# Patient Record
Sex: Female | Born: 1985 | Race: White | Hispanic: No | Marital: Single
Health system: Southern US, Community
[De-identification: ages and names within clinical notes are randomized; demographics above are authoritative.]

## PROBLEM LIST (undated history)

## (undated) DIAGNOSIS — J45909 Unspecified asthma, uncomplicated: Secondary | ICD-10-CM

---
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
## 2006-04-09 ENCOUNTER — Emergency Department: Payer: Self-pay | Admitting: Emergency Medicine

## 2006-07-05 ENCOUNTER — Emergency Department: Payer: Self-pay | Admitting: Emergency Medicine

## 2006-08-29 ENCOUNTER — Emergency Department: Payer: Self-pay | Admitting: Unknown Physician Specialty

## 2007-05-19 ENCOUNTER — Emergency Department: Payer: Self-pay | Admitting: Emergency Medicine

## 2007-10-07 ENCOUNTER — Emergency Department: Payer: Self-pay | Admitting: Emergency Medicine

## 2007-12-18 ENCOUNTER — Emergency Department: Payer: Self-pay | Admitting: Unknown Physician Specialty

## 2008-08-09 ENCOUNTER — Emergency Department: Payer: Self-pay | Admitting: Emergency Medicine

## 2008-08-26 ENCOUNTER — Emergency Department: Payer: Self-pay | Admitting: Emergency Medicine

## 2009-07-30 ENCOUNTER — Emergency Department: Payer: Self-pay | Admitting: Emergency Medicine

## 2009-10-02 ENCOUNTER — Emergency Department: Payer: Self-pay | Admitting: Emergency Medicine

## 2010-02-23 ENCOUNTER — Emergency Department: Payer: Self-pay | Admitting: Emergency Medicine

## 2010-02-24 ENCOUNTER — Emergency Department: Payer: Self-pay | Admitting: Emergency Medicine

## 2010-08-16 ENCOUNTER — Emergency Department: Payer: Self-pay | Admitting: Emergency Medicine

## 2010-09-19 ENCOUNTER — Emergency Department: Payer: Self-pay | Admitting: Internal Medicine

## 2011-01-18 ENCOUNTER — Emergency Department: Payer: Self-pay | Admitting: Emergency Medicine

## 2012-10-25 ENCOUNTER — Emergency Department: Payer: Self-pay | Admitting: Emergency Medicine

## 2014-10-04 ENCOUNTER — Emergency Department: Payer: Self-pay

## 2014-10-04 ENCOUNTER — Encounter: Payer: Self-pay | Admitting: Emergency Medicine

## 2014-10-04 DIAGNOSIS — S99921A Unspecified injury of right foot, initial encounter: Secondary | ICD-10-CM | POA: Insufficient documentation

## 2014-10-04 DIAGNOSIS — Z720 Tobacco use: Secondary | ICD-10-CM | POA: Insufficient documentation

## 2014-10-04 DIAGNOSIS — Y9289 Other specified places as the place of occurrence of the external cause: Secondary | ICD-10-CM | POA: Insufficient documentation

## 2014-10-04 DIAGNOSIS — W01198A Fall on same level from slipping, tripping and stumbling with subsequent striking against other object, initial encounter: Secondary | ICD-10-CM | POA: Insufficient documentation

## 2014-10-04 DIAGNOSIS — Y9389 Activity, other specified: Secondary | ICD-10-CM | POA: Insufficient documentation

## 2014-10-04 DIAGNOSIS — Y998 Other external cause status: Secondary | ICD-10-CM | POA: Insufficient documentation

## 2014-10-04 NOTE — ED Notes (Signed)
Patient states that she slipped and hit her right first toe. Patient with laceration to toe. Bleeding controlled.

## 2014-10-05 ENCOUNTER — Emergency Department
Admission: EM | Admit: 2014-10-05 | Discharge: 2014-10-05 | Discharge status: Against Medical Advice | Payer: Self-pay | Attending: Emergency Medicine | Admitting: Emergency Medicine

## 2019-01-20 ENCOUNTER — Other Ambulatory Visit: Payer: Self-pay

## 2019-01-20 ENCOUNTER — Emergency Department: Payer: Self-pay

## 2019-01-20 ENCOUNTER — Emergency Department
Admission: EM | Admit: 2019-01-20 | Discharge: 2019-01-20 | Disposition: A | Discharge status: Home / Self Care | Payer: Self-pay | Attending: Emergency Medicine | Admitting: Emergency Medicine

## 2019-01-20 ENCOUNTER — Encounter: Payer: Self-pay | Admitting: Emergency Medicine

## 2019-01-20 DIAGNOSIS — Z20828 Contact with and (suspected) exposure to other viral communicable diseases: Secondary | ICD-10-CM | POA: Insufficient documentation

## 2019-01-20 DIAGNOSIS — J189 Pneumonia, unspecified organism: Secondary | ICD-10-CM | POA: Insufficient documentation

## 2019-01-20 DIAGNOSIS — F17210 Nicotine dependence, cigarettes, uncomplicated: Secondary | ICD-10-CM | POA: Insufficient documentation

## 2019-01-20 MED ORDER — ALBUTEROL SULFATE HFA 108 (90 BASE) MCG/ACT IN AERS: 2.0000 | INHALATION_SPRAY | Freq: Four times a day (QID) | RESPIRATORY_TRACT | 0 refills | Status: AC | PRN

## 2019-01-20 MED ORDER — AZITHROMYCIN 500 MG PO TABS
500.0000 mg | ORAL_TABLET | Freq: Once | ORAL | Status: AC
  Administered 2019-01-20: 500 mg via ORAL
  Filled 2019-01-20: qty 1

## 2019-01-20 MED ORDER — BENZONATATE 100 MG PO CAPS: ORAL_CAPSULE | ORAL | 0 refills | Status: AC

## 2019-01-20 MED ORDER — AZITHROMYCIN 250 MG PO TABS: 250.0000 mg | ORAL_TABLET | Freq: Every day | ORAL | 0 refills | Status: AC

## 2019-01-20 NOTE — Discharge Instructions (Signed)
Your CXR is concerning for a pneumonia, including viral causes (COVID).  You will be tested for COVID, and you will be notified of POSITIVE results by phone. Take the antibiotic as directed. Take the other prescription meds as needed. Rest, hydrate, and return to the ED as needed.

## 2019-01-20 NOTE — ED Notes (Signed)
Pt reports cough and working in [employer] that's "34 degrees all the time", SOB with exertion

## 2019-01-20 NOTE — ED Provider Notes (Signed)
[facility] Emergency Department Provider Note ____________________________________________  Time seen: 2008  I have reviewed the triage vital signs and the nursing notes.  HISTORY  Chief Complaint  Cough and Generalized Body Aches  HPI Stephanie Hanson is a 33 y.o. female presents to the ED for evaluation of sudden onset of productive cough and general bodyaches, upon awakening. She also notes a sore throat. She is denying fevers, nausea, or SOB. She denies any sick contacts, recent travel or other exposures. She has previously been screened for COVID last month.    History reviewed. No pertinent past medical history.  There are no active problems to display for this patient.  History reviewed. No pertinent surgical history.  Prior to Admission medications   Medication Sig Start Date End Date Taking? Authorizing Provider  albuterol (VENTOLIN HFA) 108 (90 Base) MCG/ACT inhaler Inhale 2 puffs into the lungs every 6 (six) hours as needed for wheezing or shortness of breath. 01/20/19   Bronte Kropf, Dannielle Karvonen, PA-C  azithromycin (ZITHROMAX Z-PAK) 250 MG tablet Take 1 tablet (250 mg total) by mouth daily for 4 days. 01/21/19 01/25/19  Adaleah Forget, Dannielle Karvonen, PA-C  benzonatate (TESSALON PERLES) 100 MG capsule Take 1-2 tabs TID prn cough 01/20/19   Shatika Grinnell, Dannielle Karvonen, PA-C    Allergies Patient has no known allergies.  No family history on file.  Social History Social History   Tobacco Use  . Smoking status: Current Every Day Smoker    Packs/day: 0.50    Years: 12.00    Pack years: 6.00    Types: Cigarettes  Substance Use Topics  . Alcohol use: Yes    Alcohol/week: 14.0 standard drinks    Types: 14 Standard drinks or equivalent per week  . Drug use: No    Review of Systems  Constitutional: Negative for fever. Re[ports bodyaches.  Eyes: Negative for visual changes. ENT: Negative for sore throat. Cardiovascular: Negative for chest  pain. Respiratory: Negative for shortness of breath. Reports productive cough Gastrointestinal: Negative for abdominal pain, vomiting and diarrhea. Genitourinary: Negative for dysuria. Musculoskeletal: Negative for back pain. Skin: Negative for rash. Neurological: Negative for headaches, focal weakness or numbness. ____________________________________________  PHYSICAL EXAM:  VITAL SIGNS: ED Triage Vitals [01/20/19 1824]  Enc Vitals Group     BP (!) 126/91     Pulse Rate 78     Resp 18     Temp 98.3 F (36.8 C)     Temp Source Oral     SpO2 100 %     Weight 190 lb (86.2 kg)     Height 5\' 5"  (1.651 m)     Head Circumference      Peak Flow      Pain Score 8     Pain Loc      Pain Edu?      Excl. in [place]?     Constitutional: Alert and oriented. Well appearing and in no distress. Head: Normocephalic and atraumatic. Eyes: Conjunctivae are normal. PERRL. Normal extraocular movements Ears: Canals clear. TMs intact bilaterally. Nose: No congestion/rhinorrhea/epistaxis. Mouth/Throat: Mucous membranes are moist. Neck: Supple. No thyromegaly. Hematological/Lymphatic/Immunological: No cervical lymphadenopathy. Cardiovascular: Normal rate, regular rhythm. Normal distal pulses. Respiratory: Normal respiratory effort. No wheezes/rales/rhonchi. Gastrointestinal: Soft and nontender. No distention. Musculoskeletal: Nontender with normal range of motion in all extremities.  Neurologic:  Normal gait without ataxia. Normal speech and language. No gross focal neurologic deficits are appreciated. Skin:  Skin is warm, dry and intact. No rash  noted. ____________________________________________   LABS (pertinent positives/negatives) Labs Reviewed  SARS CORONAVIRUS 2 (TAT 6-24 HRS)  ____________________________________________   RADIOLOGY  DG CXR 1V  IMPRESSION: Pneumonia.  Clinical correlation and follow-up is  recommended. ____________________________________________  PROCEDURES  Azithromycin 500 mg PO Procedures ____________________________________________  INITIAL IMPRESSION / ASSESSMENT AND PLAN / ED COURSE  Patient with ED evaluation of sudden onset of productive cough this morning. She has a benign exam without signs of respiratory distress, dehydration, or sepsis. Her CXR confirms a pneumonia. She will be be treated empirically with azithromycin, and COVID test is pending since there is radiologic concern for a possible atypical etiology, as well. Prescriptions for Tessalon Perles, and albuterol are also sent.   Stephanie Hanson was evaluated in Emergency Department on 01/20/2019 for the symptoms described in the history of present illness. She was evaluated in the context of the global COVID-19 pandemic, which necessitated consideration that the patient might be at risk for infection with the SARS-CoV-2 virus that causes COVID-19. Institutional protocols and algorithms that pertain to the evaluation of patients at risk for COVID-19 are in a state of rapid change based on information released by regulatory bodies including the CDC and federal and state organizations. These policies and algorithms were followed during the patient's care in the ED. ____________________________________________  FINAL CLINICAL IMPRESSION(S) / ED DIAGNOSES  Final diagnoses:  Community acquired pneumonia, unspecified laterality      Karmen Stabs, Charlesetta Ivory, PA-C 01/20/19 2131    Shaune Pollack, MD 01/22/19 [phone]

## 2019-01-20 NOTE — ED Triage Notes (Signed)
Cough, generalized body aches and sore throat since this am.

## 2019-01-20 NOTE — ED Notes (Signed)
No peripheral IV placed this visit.   Discharge instructions reviewed with patient. Questions fielded by this RN. Patient verbalizes understanding of instructions. Patient discharged home in stable condition per Jenise, PA. No acute distress noted at time of discharge.   

## 2019-01-21 LAB — SARS CORONAVIRUS 2 (TAT 6-24 HRS): SARS Coronavirus 2: NEGATIVE

## 2019-01-22 ENCOUNTER — Telehealth: Payer: Self-pay | Admitting: General Practice

## 2019-01-22 NOTE — Telephone Encounter (Signed)
Patient called in and received covid test result

## 2019-01-24 ENCOUNTER — Other Ambulatory Visit: Payer: Self-pay

## 2019-01-24 ENCOUNTER — Encounter: Payer: Self-pay | Admitting: Emergency Medicine

## 2019-01-24 ENCOUNTER — Emergency Department
Admission: EM | Admit: 2019-01-24 | Discharge: 2019-01-24 | Disposition: A | Discharge status: Home / Self Care | Payer: Medicaid Other | Attending: Emergency Medicine | Admitting: Emergency Medicine

## 2019-01-24 DIAGNOSIS — J189 Pneumonia, unspecified organism: Secondary | ICD-10-CM | POA: Insufficient documentation

## 2019-01-24 DIAGNOSIS — F17210 Nicotine dependence, cigarettes, uncomplicated: Secondary | ICD-10-CM | POA: Insufficient documentation

## 2019-01-24 NOTE — Discharge Instructions (Signed)
Follow-up with your primary care provider or [facility] if you do not have a provider.  Their contact information is listed on your discharge papers.  Also increase fluids.  Decrease or discontinue smoking as this will help with your pneumonia and your coughing.

## 2019-01-24 NOTE — ED Triage Notes (Signed)
Says she is here for recheck of pneumonia.  Says she took antibiotics.

## 2019-01-24 NOTE — ED Provider Notes (Signed)
[facility] Emergency Department Provider Note  ____________________________________________   First MD Initiated Contact with Patient 01/24/19 0940     (approximate)  I have reviewed the triage vital signs and the nursing notes.   HISTORY  Chief Complaint Cough   HPI Stephanie Hanson is a 33 y.o. female presents to the ED for "recheck of her pneumonia".  Patient was seen in the ED on 01/20/2019 at which time she was diagnosed with community-acquired pneumonia and treated with Zithromax.  Patient denies any fever, chills, nausea or vomiting.  She was at work today when she coughed a couple times and was sent to the emergency department to be checked before coming to work.  Patient's Covid test from 10/24 was negative.     History reviewed. No pertinent past medical history.  There are no active problems to display for this patient.   History reviewed. No pertinent surgical history.  Prior to Admission medications   Medication Sig Start Date End Date Taking? Authorizing Provider  albuterol (VENTOLIN HFA) 108 (90 Base) MCG/ACT inhaler Inhale 2 puffs into the lungs every 6 (six) hours as needed for wheezing or shortness of breath. 01/20/19   Menshew, Dannielle Karvonen, PA-C  azithromycin (ZITHROMAX Z-PAK) 250 MG tablet Take 1 tablet (250 mg total) by mouth daily for 4 days. 01/21/19 01/25/19  Menshew, Dannielle Karvonen, PA-C  benzonatate (TESSALON PERLES) 100 MG capsule Take 1-2 tabs TID prn cough 01/20/19   Menshew, Dannielle Karvonen, PA-C    Allergies Patient has no known allergies.  No family history on file.  Social History Social History   Tobacco Use  . Smoking status: Current Every Day Smoker    Packs/day: 0.50    Years: 12.00    Pack years: 6.00    Types: Cigarettes  . Smokeless tobacco: Never Used  Substance Use Topics  . Alcohol use: Yes    Alcohol/week: 14.0 standard drinks    Types: 14 Standard drinks or equivalent per week  . Drug use: No     Review of Systems Constitutional: No fever/chills Cardiovascular: Denies chest pain. Respiratory: Denies shortness of breath.  Positive for cough. Gastrointestinal: No abdominal pain.  No nausea, no vomiting. Genitourinary: Negative for dysuria. Musculoskeletal: Negative for muscle aches. Skin: Negative for rash. Neurological: Negative for headaches, focal weakness or numbness. ___________________________________________   PHYSICAL EXAM:  VITAL SIGNS: ED Triage Vitals  Enc Vitals Group     BP 01/24/19 0858 119/75     Pulse Rate 01/24/19 0858 88     Resp 01/24/19 0858 18     Temp 01/24/19 0858 98 F (36.7 C)     Temp Source 01/24/19 0858 Oral     SpO2 01/24/19 0858 99 %     Weight 01/24/19 0851 189 lb 13.1 oz (86.1 kg)     Height 01/24/19 0851 5\' 5"  (1.651 m)     Head Circumference --      Peak Flow --      Pain Score 01/24/19 0854 7     Pain Loc --      Pain Edu? --      Excl. in [place]? --    Constitutional: Alert and oriented. Well appearing and in no acute distress. Eyes: Conjunctivae are normal.  Head: Atraumatic. Neck: No stridor.   Cardiovascular: Normal rate, regular rhythm. Grossly normal heart sounds.  Good peripheral circulation. Respiratory: Normal respiratory effort.  No retractions. Lungs CTAB. Gastrointestinal: Soft and nontender. No distention. No  abdominal bruits. No CVA tenderness. Neurologic:  Normal speech and language. No gross focal neurologic deficits are appreciated. No gait instability. Skin:  Skin is warm, dry and intact. No rash noted. Psychiatric: Mood and affect are normal. Speech and behavior are normal.  ____________________________________________   LABS (all labs ordered are listed, but only abnormal results are displayed)  Labs Reviewed - No data to display __________________________________________   PROCEDURES  Procedure(s) performed (including Critical Care):  Procedures  ____________________________________________    INITIAL IMPRESSION / ASSESSMENT AND PLAN / ED COURSE  As part of my medical decision making, I reviewed the following data within the electronic MEDICAL RECORD NUMBER Notes from prior ED visits and [place] Controlled Substance Database  33 year old female presents to the ED with complaint of dysuria for 1 week and also complains of her right knee hurting for the last 3 months.  There is been no history of injury to her knee.  She has not been taking over-the-counter medication for this.  Patient has been taking Azo over-the-counter for her dysuria which helped early in her symptoms.  She denies any flank pain, fever or chills.  Urinalysis confirms a UTI.  X-rays and physical exam was unremarkable for her right knee.  Patient was discharged with a prescription for meloxicam 15 mg daily, Keflex 500 mg 3 times daily and Pyridium 200 mg 3 times daily.  Patient is encouraged to increase fluids.  She is to follow-up with her PCP if any continued problems and return to the emergency department if any severe worsening of her symptoms.  ____________________________________________   FINAL CLINICAL IMPRESSION(S) / ED DIAGNOSES  Final diagnoses:  Community acquired pneumonia, unspecified laterality  Cigarette smoker     ED Discharge Orders    None       Note:  This document was prepared using Dragon voice recognition software and may include unintentional dictation errors.    Tommi Rumps, PA-C 01/24/19 1607    Sharman Cheek, MD 01/26/19 2044

## 2019-09-26 ENCOUNTER — Telehealth: Payer: Self-pay | Admitting: General Practice

## 2019-09-26 NOTE — Telephone Encounter (Signed)
Individual has been contacted regarding ED referral and ended the phone call. No further attempts to contact individual will be made. 

## 2019-10-15 ENCOUNTER — Other Ambulatory Visit: Payer: Self-pay

## 2019-10-15 ENCOUNTER — Encounter: Payer: Self-pay | Admitting: Emergency Medicine

## 2019-10-15 ENCOUNTER — Emergency Department
Admission: EM | Admit: 2019-10-15 | Discharge: 2019-10-15 | Disposition: A | Discharge status: Home / Self Care | Payer: Medicaid Other | Attending: Emergency Medicine | Admitting: Emergency Medicine

## 2019-10-15 ENCOUNTER — Telehealth: Payer: Self-pay | Admitting: Emergency Medicine

## 2019-10-15 DIAGNOSIS — Z5321 Procedure and treatment not carried out due to patient leaving prior to being seen by health care provider: Secondary | ICD-10-CM | POA: Insufficient documentation

## 2019-10-15 DIAGNOSIS — R1110 Vomiting, unspecified: Secondary | ICD-10-CM | POA: Insufficient documentation

## 2019-10-15 HISTORY — DX: Unspecified asthma, uncomplicated: J45.909

## 2019-10-15 LAB — CBC
HCT: 49.3 % — ABNORMAL HIGH (ref 36.0–46.0)
Hemoglobin: 16.4 g/dL — ABNORMAL HIGH (ref 12.0–15.0)
MCH: 34 pg (ref 26.0–34.0)
MCHC: 33.3 g/dL (ref 30.0–36.0)
MCV: 102.1 fL — ABNORMAL HIGH (ref 80.0–100.0)
Platelets: 255 10*3/uL (ref 150–400)
RBC: 4.83 MIL/uL (ref 3.87–5.11)
RDW: 13.7 % (ref 11.5–15.5)
WBC: 11 10*3/uL — ABNORMAL HIGH (ref 4.0–10.5)
nRBC: 0 % (ref 0.0–0.2)

## 2019-10-15 LAB — COMPREHENSIVE METABOLIC PANEL
ALT: 32 U/L (ref 0–44)
AST: 22 U/L (ref 15–41)
Albumin: 3.7 g/dL (ref 3.5–5.0)
Alkaline Phosphatase: 83 U/L (ref 38–126)
Anion gap: 3 — ABNORMAL LOW (ref 5–15)
BUN: 14 mg/dL (ref 6–20)
CO2: 29 mmol/L (ref 22–32)
Calcium: 8.7 mg/dL — ABNORMAL LOW (ref 8.9–10.3)
Chloride: 106 mmol/L (ref 98–111)
Creatinine, Ser: 0.87 mg/dL (ref 0.44–1.00)
GFR calc Af Amer: >60 mL/min (ref >60)
GFR calc non Af Amer: >60 mL/min (ref >60)
Glucose, Bld: 95 mg/dL (ref 70–99)
Potassium: 4.2 mmol/L (ref 3.5–5.1)
Sodium: 138 mmol/L (ref 135–145)
Total Bilirubin: 0.4 mg/dL (ref 0.3–1.2)
Total Protein: 6.9 g/dL (ref 6.5–8.1)

## 2019-10-15 LAB — LIPASE, BLOOD: Lipase: 39 U/L (ref 11–51)

## 2019-10-15 NOTE — Telephone Encounter (Signed)
Patient called me back.  She does have a pcp and will call for appt.

## 2019-10-15 NOTE — Telephone Encounter (Signed)
Called patient due to lwot to inquire about condition and follow up plans. Left message.   

## 2019-10-15 NOTE — ED Triage Notes (Addendum)
Patient states that she woke up an hour ago and vomited dark brown blood about an hour ago. Patient denies abdominal pain.

## 2019-11-20 ENCOUNTER — Other Ambulatory Visit: Payer: Medicaid Other

## 2019-11-26 ENCOUNTER — Ambulatory Visit: Payer: Medicaid Other

## 2019-11-26 ENCOUNTER — Ambulatory Visit: Payer: Medicaid Other | Attending: Internal Medicine

## 2019-11-26 DIAGNOSIS — Z23 Encounter for immunization: Secondary | ICD-10-CM

## 2019-11-26 NOTE — Progress Notes (Signed)
   Covid-19 Vaccination Clinic  Name:  Stephanie Hanson    MRN: [id number] DOB: 1985-09-02  11/26/2019  [name] was observed post Covid-19 immunization for 15 minutes without incident. She was provided with Vaccine Information Sheet and instruction to access the V-Safe system.   [name] was instructed to call 911 with any severe reactions post vaccine: Marland Kitchen Difficulty breathing  . Swelling of face and throat  . A fast heartbeat  . A bad rash all over body  . Dizziness and weakness   Immunizations Administered    Name Date Dose VIS Date Route   Pfizer COVID-19 Vaccine 11/26/2019 11:09 AM 0.3 mL 05/23/2018 Intramuscular   Manufacturer: [employer], [employer]   Lot: [id number]   NDC: [address]-0

## 2019-12-17 ENCOUNTER — Ambulatory Visit: Payer: Medicaid Other

## 2020-07-04 IMAGING — DX DG CHEST 1V PORT
1 series · 1 of 1 positions shown · non-contrast
Comparison: Chest radiograph dated 07/05/2006

CLINICAL DATA: 33-year-old female with cough and generalized body
aches.

EXAM:
PORTABLE CHEST 1 VIEW

[chest ap]
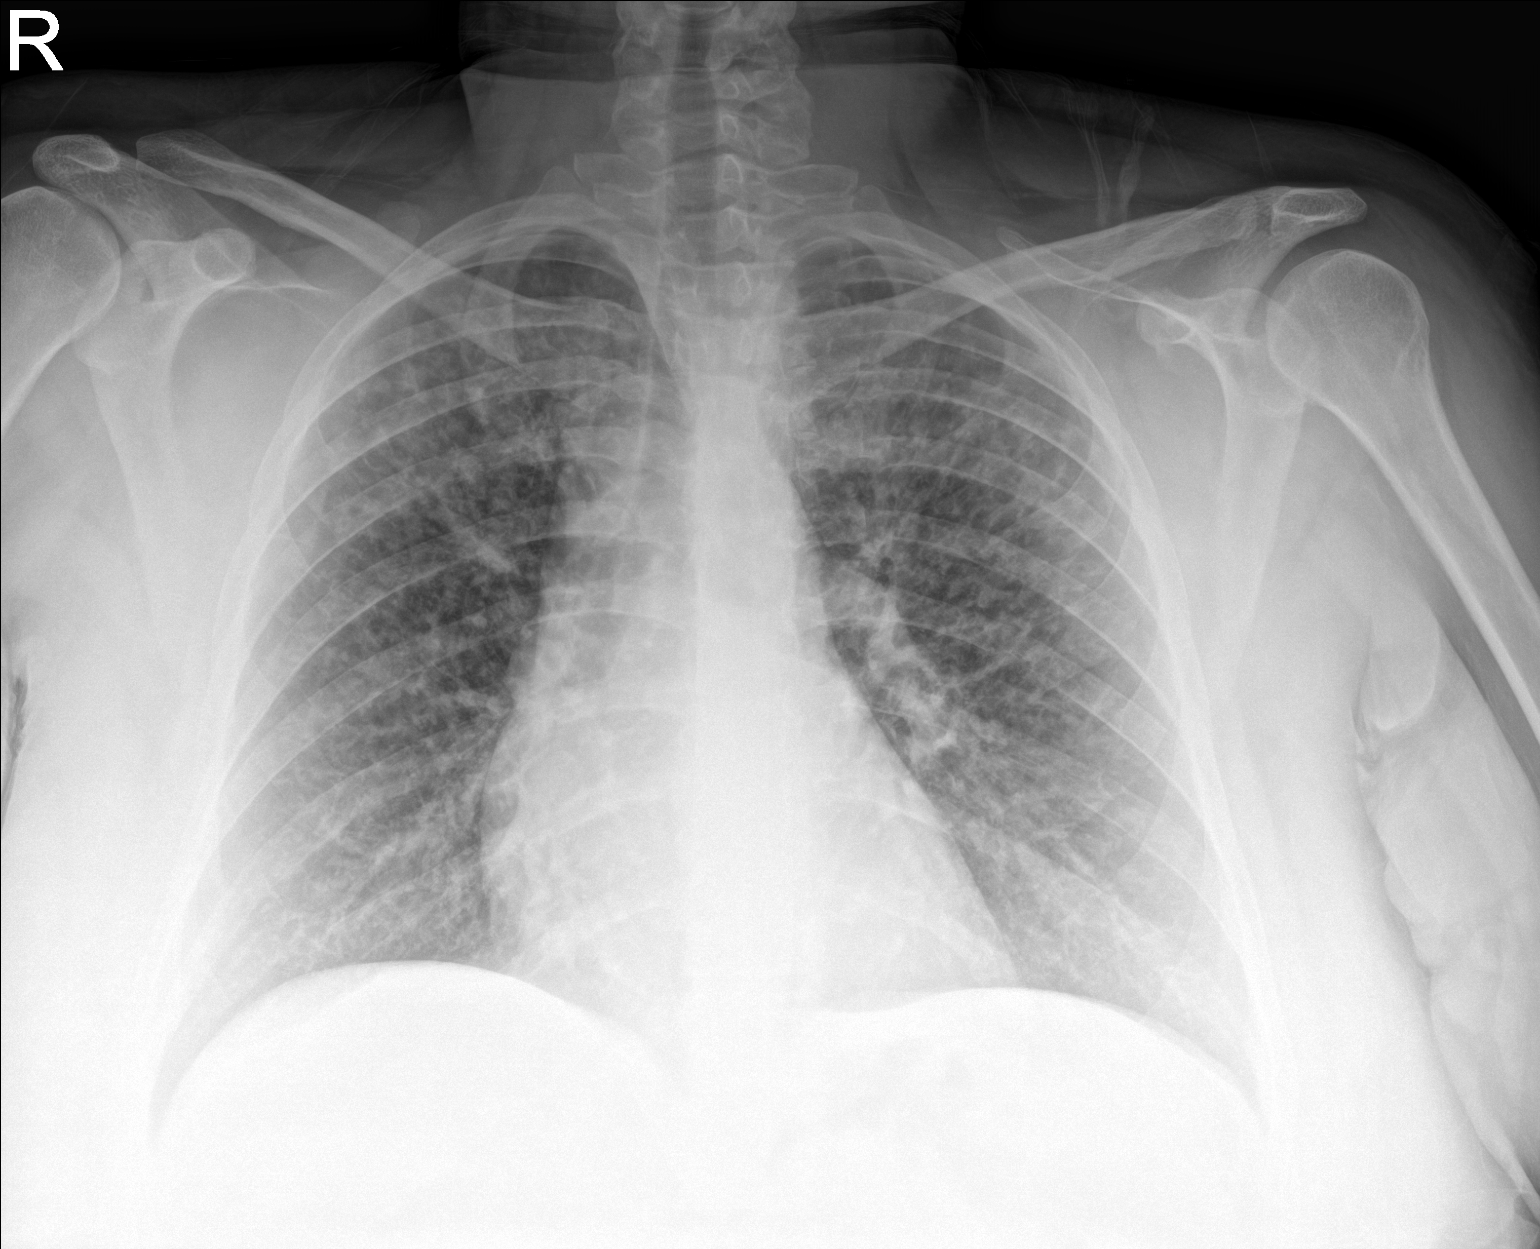

[1 of 1 positions shown; findings below may reference images not displayed]

FINDINGS: Diffuse reticulonodular densities throughout the lungs most
consistent with pneumonia, possibly atypical in etiology. Clinical
correlation is recommended. No lobar consolidation, pleural
effusion, or pneumothorax. The cardiac silhouette is within normal
limits. No acute osseous pathology.
IMPRESSION: Pneumonia.  Clinical correlation and follow-up is recommended.

## 2020-11-07 ENCOUNTER — Other Ambulatory Visit: Payer: Self-pay

## 2020-11-07 ENCOUNTER — Ambulatory Visit (LOCAL_COMMUNITY_HEALTH_CENTER): Payer: Self-pay | Admitting: Nurse Practitioner

## 2020-11-07 DIAGNOSIS — Z23 Encounter for immunization: Secondary | ICD-10-CM

## 2020-11-07 NOTE — Progress Notes (Signed)
35 year old female in clinic today for immunizations.  Patient states that she stepped on a rusty nail.  Patient given a Tdap today. Glenna Fellows, RN

## 2021-11-21 ENCOUNTER — Other Ambulatory Visit: Payer: Self-pay

## 2021-11-21 DIAGNOSIS — K0889 Other specified disorders of teeth and supporting structures: Secondary | ICD-10-CM | POA: Insufficient documentation

## 2021-11-21 DIAGNOSIS — K029 Dental caries, unspecified: Secondary | ICD-10-CM | POA: Diagnosis not present

## 2021-11-21 DIAGNOSIS — K047 Periapical abscess without sinus: Secondary | ICD-10-CM | POA: Diagnosis not present

## 2021-11-21 DIAGNOSIS — D72829 Elevated white blood cell count, unspecified: Secondary | ICD-10-CM | POA: Insufficient documentation

## 2021-11-21 LAB — CBC WITH DIFFERENTIAL/PLATELET
Abs Immature Granulocytes: 0.09 10*3/uL — ABNORMAL HIGH (ref 0.00–0.07)
Basophils Absolute: 0.1 10*3/uL (ref 0.0–0.1)
Basophils Relative: 1 %
Eosinophils Absolute: 0.4 10*3/uL (ref 0.0–0.5)
Eosinophils Relative: 3 %
HCT: 48.6 % — ABNORMAL HIGH (ref 36.0–46.0)
Hemoglobin: 15.9 g/dL — ABNORMAL HIGH (ref 12.0–15.0)
Immature Granulocytes: 1 %
Lymphocytes Relative: 18 %
Lymphs Abs: 2.6 10*3/uL (ref 0.7–4.0)
MCH: 32.9 pg (ref 26.0–34.0)
MCHC: 32.7 g/dL (ref 30.0–36.0)
MCV: 100.6 fL — ABNORMAL HIGH (ref 80.0–100.0)
Monocytes Absolute: 0.8 10*3/uL (ref 0.1–1.0)
Monocytes Relative: 6 %
Neutro Abs: 10.2 10*3/uL — ABNORMAL HIGH (ref 1.7–7.7)
Neutrophils Relative %: 71 %
Platelets: 330 10*3/uL (ref 150–400)
RBC: 4.83 MIL/uL (ref 3.87–5.11)
RDW: 13.3 % (ref 11.5–15.5)
WBC: 14.1 10*3/uL — ABNORMAL HIGH (ref 4.0–10.5)
nRBC: 0 % (ref 0.0–0.2)

## 2021-11-21 LAB — COMPREHENSIVE METABOLIC PANEL
ALT: 29 U/L (ref 0–44)
AST: 19 U/L (ref 15–41)
Albumin: 4 g/dL (ref 3.5–5.0)
Alkaline Phosphatase: 64 U/L (ref 38–126)
Anion gap: 8 (ref 5–15)
BUN: 21 mg/dL — ABNORMAL HIGH (ref 6–20)
CO2: 25 mmol/L (ref 22–32)
Calcium: 8.9 mg/dL (ref 8.9–10.3)
Chloride: 107 mmol/L (ref 98–111)
Creatinine, Ser: 0.93 mg/dL (ref 0.44–1.00)
GFR, Estimated: >60 mL/min (ref >60)
Glucose, Bld: 109 mg/dL — ABNORMAL HIGH (ref 70–99)
Potassium: 4 mmol/L (ref 3.5–5.1)
Sodium: 140 mmol/L (ref 135–145)
Total Bilirubin: 0.8 mg/dL (ref 0.3–1.2)
Total Protein: 7 g/dL (ref 6.5–8.1)

## 2021-11-21 LAB — LACTIC ACID, PLASMA: Lactic Acid, Venous: 0.9 mmol/L (ref 0.5–1.9)

## 2021-11-21 MED ORDER — IBUPROFEN 800 MG PO TABS: 800.0000 mg | ORAL_TABLET | Freq: Once | ORAL | Status: AC

## 2021-11-21 MED ORDER — IBUPROFEN 800 MG PO TABS
ORAL_TABLET | ORAL | Status: AC
  Administered 2021-11-21: 800 mg via ORAL
  Filled 2021-11-21: qty 1

## 2021-11-21 NOTE — ED Triage Notes (Signed)
FIRST NURSE NOTE:  Arrived via [facility] from home tooth and gum pain x 2 days.  132/78  P88 98%RA 20RR

## 2021-11-21 NOTE — ED Triage Notes (Signed)
Pt c/o upper right dental pain x2 days, states she has not seen dentist due to lack of insurance. Pt has been taking otc meds without relief. Pt AOX4, appears uncomfortable and tearful in triage. States "I just wanna go to sleep."

## 2021-11-22 ENCOUNTER — Emergency Department
Admission: EM | Admit: 2021-11-22 | Discharge: 2021-11-22 | Disposition: A | Discharge status: Home / Self Care | Payer: 59 | Attending: Emergency Medicine | Admitting: Emergency Medicine

## 2021-11-22 DIAGNOSIS — K047 Periapical abscess without sinus: Secondary | ICD-10-CM

## 2021-11-22 DIAGNOSIS — K029 Dental caries, unspecified: Secondary | ICD-10-CM

## 2021-11-22 MED ORDER — AMOXICILLIN 500 MG PO CAPS
500.0000 mg | ORAL_CAPSULE | Freq: Once | ORAL | Status: AC
Start: 2021-11-22 — End: 2021-11-22
  Administered 2021-11-22: 500 mg via ORAL
  Filled 2021-11-22: qty 1

## 2021-11-22 MED ORDER — KETOROLAC TROMETHAMINE 30 MG/ML IJ SOLN
30.0000 mg | Freq: Once | INTRAMUSCULAR | Status: AC
  Administered 2021-11-22: 30 mg via INTRAMUSCULAR
  Filled 2021-11-22: qty 1

## 2021-11-22 MED ORDER — ACETAMINOPHEN 500 MG PO TABS
1000.0000 mg | ORAL_TABLET | Freq: Once | ORAL | Status: AC
  Administered 2021-11-22: 1000 mg via ORAL
  Filled 2021-11-22: qty 2

## 2021-11-22 MED ORDER — AMOXICILLIN 500 MG PO TABS: 500.0000 mg | ORAL_TABLET | Freq: Two times a day (BID) | ORAL | 0 refills | Status: AC

## 2021-11-22 NOTE — ED Provider Notes (Signed)
[facility] Provider Note    Event Date/Time   First MD Initiated Contact with Patient 11/22/21 0011     (approximate)   History   Dental Pain   HPI  Stephanie Hanson is a 36 y.o. female who presents to the ED for evaluation of Dental Pain    Patient presents to the ED for evaluation of dental pain to the right side of her maxillary teeth.  Reports chills and sensation of swelling to the area.  Physical Exam   Triage Vital Signs: ED Triage Vitals  Enc Vitals Group     BP 11/21/21 2113 (!) 142/93     Pulse Rate 11/21/21 2113 92     Resp 11/21/21 2113 18     Temp 11/21/21 2113 98.4 F (36.9 C)     Temp Source 11/21/21 2113 Axillary     SpO2 11/21/21 2113 95 %     Weight 11/21/21 2114 224 lb (101.6 kg)     Height 11/21/21 2114 5\' 5"  (1.651 m)     Head Circumference --      Peak Flow --      Pain Score 11/21/21 2113 10     Pain Loc --      Pain Edu? --      Excl. in [place]? --     Most recent vital signs: Vitals:   11/21/21 2113  BP: (!) 142/93  Pulse: 92  Resp: 18  Temp: 98.4 F (36.9 C)  SpO2: 95%    General: Awake, no distress.  Looks systemically well.  Very poor dentition throughout with various carious and fractured teeth.  Fullness and tenderness to palpation to right-sided maxillary teeth around teeth numbers 6 and 7.  No discrete abscess or any purulent discharge. Uvula is midline.  No signs of upper airway obstruction. CV:  Good peripheral perfusion.  Resp:  Normal effort.  Abd:  No distention.  MSK:  No deformity noted.  Neuro:  No focal deficits appreciated. Other:     ED Results / Procedures / Treatments   Labs (all labs ordered are listed, but only abnormal results are displayed) Labs Reviewed  CBC WITH DIFFERENTIAL/PLATELET - Abnormal; Notable for the following components:      Result Value   WBC 14.1 (*)    Hemoglobin 15.9 (*)    HCT 48.6 (*)    MCV 100.6 (*)    Neutro Abs 10.2 (*)    Abs Immature Granulocytes  0.09 (*)    All other components within normal limits  COMPREHENSIVE METABOLIC PANEL - Abnormal; Notable for the following components:   Glucose, Bld 109 (*)    BUN 21 (*)    All other components within normal limits  LACTIC ACID, PLASMA    EKG   RADIOLOGY   Official radiology report(s): No results found.  PROCEDURES and INTERVENTIONS:  Procedures  Medications  ibuprofen (ADVIL) tablet 800 mg (800 mg Oral Given 11/21/21 2304)     IMPRESSION / MDM / ASSESSMENT AND PLAN / ED COURSE  I reviewed the triage vital signs and the nursing notes.  Differential diagnosis includes, but is not limited to,   36 year old woman presents with evidence of possible developing dental infection suitable for outpatient management with dentistry follow-up.  Blood work with leukocytosis, but she looks systemically well and I doubt sepsis.  Essentially normal metabolic panel and normal lactic acid.  She started on antibiotics and provided local community resources for dentistry follow-up.  We discussed  nonnarcotic multimodal analgesia as well as return precautions for the ED.      FINAL CLINICAL IMPRESSION(S) / ED DIAGNOSES   Final diagnoses:  None     Rx / DC Orders   ED Discharge Orders     None        Note:  This document was prepared using Dragon voice recognition software and may include unintentional dictation errors.   Delton Prairie, MD 11/22/21 [phone]

## 2022-08-13 DIAGNOSIS — M545 Low back pain, unspecified: Secondary | ICD-10-CM | POA: Diagnosis not present

## 2022-08-13 DIAGNOSIS — R232 Flushing: Secondary | ICD-10-CM | POA: Diagnosis not present

## 2022-08-13 DIAGNOSIS — T7840XA Allergy, unspecified, initial encounter: Secondary | ICD-10-CM | POA: Diagnosis not present

## 2022-08-13 DIAGNOSIS — Z124 Encounter for screening for malignant neoplasm of cervix: Secondary | ICD-10-CM | POA: Diagnosis not present

## 2022-08-13 DIAGNOSIS — J4520 Mild intermittent asthma, uncomplicated: Secondary | ICD-10-CM | POA: Diagnosis not present

## 2022-08-13 DIAGNOSIS — B351 Tinea unguium: Secondary | ICD-10-CM | POA: Diagnosis not present

## 2022-08-13 DIAGNOSIS — Z6841 Body Mass Index (BMI) 40.0 and over, adult: Secondary | ICD-10-CM | POA: Diagnosis not present
